# Patient Record
Sex: Female | Born: 1960
Health system: Southern US, Community
[De-identification: ages and names within clinical notes are randomized; demographics above are authoritative.]

## PROBLEM LIST (undated history)

## (undated) DIAGNOSIS — E785 Hyperlipidemia, unspecified: Secondary | ICD-10-CM

## (undated) HISTORY — PX: CERVICAL CONE BIOPSY: SUR198

## (undated) HISTORY — PX: BREAST CYST EXCISION: SHX579

---

## 2020-04-26 ENCOUNTER — Other Ambulatory Visit: Payer: Self-pay

## 2020-04-26 DIAGNOSIS — Z20822 Contact with and (suspected) exposure to covid-19: Secondary | ICD-10-CM

## 2020-04-27 LAB — NOVEL CORONAVIRUS, NAA: SARS-CoV-2, NAA: NOT DETECTED

## 2020-04-27 LAB — SARS-COV-2, NAA 2 DAY TAT

## 2020-08-30 ENCOUNTER — Other Ambulatory Visit: Payer: Self-pay | Admitting: Family Medicine

## 2020-08-30 DIAGNOSIS — Z1231 Encounter for screening mammogram for malignant neoplasm of breast: Secondary | ICD-10-CM

## 2020-09-03 ENCOUNTER — Ambulatory Visit
Admission: RE | Admit: 2020-09-03 | Discharge: 2020-09-03 | Disposition: A | Discharge status: Home / Self Care | Payer: 59 | Source: Ambulatory Visit | Attending: Family Medicine | Admitting: Family Medicine

## 2020-09-03 ENCOUNTER — Other Ambulatory Visit: Payer: Self-pay

## 2020-09-03 DIAGNOSIS — Z1231 Encounter for screening mammogram for malignant neoplasm of breast: Secondary | ICD-10-CM | POA: Insufficient documentation

## 2020-09-08 ENCOUNTER — Other Ambulatory Visit: Payer: Self-pay

## 2020-09-08 DIAGNOSIS — R5383 Other fatigue: Secondary | ICD-10-CM | POA: Diagnosis not present

## 2020-09-08 DIAGNOSIS — Z1389 Encounter for screening for other disorder: Secondary | ICD-10-CM | POA: Diagnosis not present

## 2020-09-08 DIAGNOSIS — E559 Vitamin D deficiency, unspecified: Secondary | ICD-10-CM | POA: Diagnosis not present

## 2020-09-08 DIAGNOSIS — L608 Other nail disorders: Secondary | ICD-10-CM | POA: Diagnosis not present

## 2020-09-08 DIAGNOSIS — E785 Hyperlipidemia, unspecified: Secondary | ICD-10-CM | POA: Diagnosis not present

## 2020-09-08 MED ORDER — TERBINAFINE HCL 250 MG PO TABS
ORAL_TABLET | ORAL | 0 refills | Status: DC
  Filled 2020-09-08: qty 90, 90d supply, fill #0

## 2020-09-08 MED ORDER — CICLOPIROX 8 % EX SOLN
CUTANEOUS | 1 refills | Status: DC
  Filled 2020-09-08: qty 6.6, 30d supply, fill #0

## 2020-09-08 MED ORDER — FAMCICLOVIR 500 MG PO TABS
ORAL_TABLET | ORAL | 2 refills | Status: DC
  Filled 2020-09-08: qty 6, 2d supply, fill #0
  Filled 2020-10-15: qty 6, 2d supply, fill #1

## 2020-09-08 MED ORDER — ROSUVASTATIN CALCIUM 20 MG PO TABS
ORAL_TABLET | ORAL | 0 refills | Status: DC
  Filled 2020-09-08: qty 90, 90d supply, fill #0

## 2020-09-09 ENCOUNTER — Other Ambulatory Visit: Payer: Self-pay

## 2020-09-10 ENCOUNTER — Inpatient Hospital Stay
Admission: RE | Admit: 2020-09-10 | Discharge: 2020-09-10 | Disposition: A | Discharge status: Home / Self Care | Payer: Self-pay | Source: Ambulatory Visit | Attending: *Deleted | Admitting: *Deleted

## 2020-09-10 ENCOUNTER — Other Ambulatory Visit: Payer: Self-pay | Admitting: *Deleted

## 2020-09-10 DIAGNOSIS — Z1231 Encounter for screening mammogram for malignant neoplasm of breast: Secondary | ICD-10-CM

## 2020-09-13 ENCOUNTER — Inpatient Hospital Stay
Admission: RE | Admit: 2020-09-13 | Discharge: 2020-09-13 | Disposition: A | Discharge status: Home / Self Care | Payer: Self-pay | Source: Ambulatory Visit | Attending: *Deleted | Admitting: *Deleted

## 2020-09-13 ENCOUNTER — Other Ambulatory Visit: Payer: Self-pay | Admitting: *Deleted

## 2020-09-13 DIAGNOSIS — Z1231 Encounter for screening mammogram for malignant neoplasm of breast: Secondary | ICD-10-CM

## 2020-09-17 DIAGNOSIS — R5381 Other malaise: Secondary | ICD-10-CM | POA: Diagnosis not present

## 2020-09-17 DIAGNOSIS — E559 Vitamin D deficiency, unspecified: Secondary | ICD-10-CM | POA: Diagnosis not present

## 2020-09-17 DIAGNOSIS — R7989 Other specified abnormal findings of blood chemistry: Secondary | ICD-10-CM | POA: Diagnosis not present

## 2020-09-17 DIAGNOSIS — R5383 Other fatigue: Secondary | ICD-10-CM | POA: Diagnosis not present

## 2020-09-17 DIAGNOSIS — Z79899 Other long term (current) drug therapy: Secondary | ICD-10-CM | POA: Diagnosis not present

## 2020-09-17 DIAGNOSIS — I10 Essential (primary) hypertension: Secondary | ICD-10-CM | POA: Diagnosis not present

## 2020-09-17 DIAGNOSIS — Z8249 Family history of ischemic heart disease and other diseases of the circulatory system: Secondary | ICD-10-CM | POA: Diagnosis not present

## 2020-09-17 DIAGNOSIS — E7800 Pure hypercholesterolemia, unspecified: Secondary | ICD-10-CM | POA: Diagnosis not present

## 2020-09-17 DIAGNOSIS — I739 Peripheral vascular disease, unspecified: Secondary | ICD-10-CM | POA: Diagnosis not present

## 2020-09-17 DIAGNOSIS — E039 Hypothyroidism, unspecified: Secondary | ICD-10-CM | POA: Diagnosis not present

## 2020-10-05 ENCOUNTER — Ambulatory Visit: Payer: 59 | Attending: Internal Medicine

## 2020-10-05 DIAGNOSIS — Z23 Encounter for immunization: Secondary | ICD-10-CM

## 2020-10-05 NOTE — Progress Notes (Signed)
   Covid-19 Vaccination Clinic  Name:  Faith Patricelli    MRN: [id number] DOB: [birth year]  10/05/2020  Ms. Leano was observed post Covid-19 immunization for 15 minutes without incident. She was provided with Vaccine Information Sheet and instruction to access the V-Safe system.   Ms. Apt was instructed to call 911 with any severe reactions post vaccine: Marland Kitchen Difficulty breathing  . Swelling of face and throat  . A fast heartbeat  . A bad rash all over body  . Dizziness and weakness   Immunizations Administered    Name Date Dose VIS Date Route   PFIZER Comrnaty(Gray TOP) Covid-19 Vaccine 10/05/2020  2:44 PM 0.3 mL 04/08/2020 Intramuscular   Manufacturer: [employer], [employer]   Lot: [id number]   NDC: [address]-2     Drusilla Kanner, PharmD, MBA Clinical Acute Care Pharmacist

## 2020-10-06 ENCOUNTER — Other Ambulatory Visit: Payer: Self-pay

## 2020-10-06 MED ORDER — PFIZER-BIONT COVID-19 VAC-TRIS 30 MCG/0.3ML IM SUSP
INTRAMUSCULAR | 0 refills | Status: DC
Start: 2020-10-05 — End: 2022-10-18
  Filled 2020-10-06: qty 0.3, 20d supply, fill #0

## 2020-10-15 ENCOUNTER — Other Ambulatory Visit: Payer: Self-pay

## 2020-10-19 ENCOUNTER — Other Ambulatory Visit: Payer: Self-pay

## 2020-10-22 ENCOUNTER — Other Ambulatory Visit
Admission: RE | Admit: 2020-10-22 | Discharge: 2020-10-22 | Disposition: A | Discharge status: Home / Self Care | Payer: 59 | Source: Skilled Nursing Facility | Attending: Internal Medicine | Admitting: Internal Medicine

## 2020-11-02 ENCOUNTER — Other Ambulatory Visit: Payer: Self-pay

## 2020-11-29 DIAGNOSIS — G40909 Epilepsy, unspecified, not intractable, without status epilepticus: Secondary | ICD-10-CM | POA: Insufficient documentation

## 2020-12-10 DIAGNOSIS — R569 Unspecified convulsions: Secondary | ICD-10-CM | POA: Insufficient documentation

## 2020-12-10 DIAGNOSIS — R600 Localized edema: Secondary | ICD-10-CM | POA: Insufficient documentation

## 2020-12-10 DIAGNOSIS — R55 Syncope and collapse: Secondary | ICD-10-CM | POA: Insufficient documentation

## 2020-12-10 DIAGNOSIS — R4189 Other symptoms and signs involving cognitive functions and awareness: Secondary | ICD-10-CM | POA: Insufficient documentation

## 2020-12-10 DIAGNOSIS — R079 Chest pain, unspecified: Secondary | ICD-10-CM | POA: Insufficient documentation

## 2020-12-30 ENCOUNTER — Ambulatory Visit: Payer: 59 | Admitting: Internal Medicine

## 2021-01-31 ENCOUNTER — Emergency Department
Admission: EM | Admit: 2021-01-31 | Discharge: 2021-01-31 | Disposition: A | Discharge status: Home / Self Care | Payer: BC Managed Care – PPO | Attending: Emergency Medicine | Admitting: Emergency Medicine

## 2021-01-31 ENCOUNTER — Emergency Department: Payer: BC Managed Care – PPO

## 2021-01-31 ENCOUNTER — Encounter: Payer: Self-pay | Admitting: *Deleted

## 2021-01-31 ENCOUNTER — Other Ambulatory Visit: Payer: Self-pay

## 2021-01-31 DIAGNOSIS — R4182 Altered mental status, unspecified: Secondary | ICD-10-CM | POA: Insufficient documentation

## 2021-01-31 DIAGNOSIS — R791 Abnormal coagulation profile: Secondary | ICD-10-CM | POA: Diagnosis not present

## 2021-01-31 DIAGNOSIS — R569 Unspecified convulsions: Secondary | ICD-10-CM | POA: Diagnosis not present

## 2021-01-31 HISTORY — DX: Hyperlipidemia, unspecified: E78.5

## 2021-01-31 LAB — CBC WITH DIFFERENTIAL/PLATELET
Abs Immature Granulocytes: 0.02 10*3/uL (ref 0.00–0.07)
Basophils Absolute: 0.1 10*3/uL (ref 0.0–0.1)
Basophils Relative: 1 %
Eosinophils Absolute: 0.1 10*3/uL (ref 0.0–0.5)
Eosinophils Relative: 2 %
HCT: 44 % (ref 36.0–46.0)
Hemoglobin: 15.2 g/dL — ABNORMAL HIGH (ref 12.0–15.0)
Immature Granulocytes: 0 %
Lymphocytes Relative: 31 %
Lymphs Abs: 2.8 10*3/uL (ref 0.7–4.0)
MCH: 32.8 pg (ref 26.0–34.0)
MCHC: 34.5 g/dL (ref 30.0–36.0)
MCV: 94.8 fL (ref 80.0–100.0)
Monocytes Absolute: 0.6 10*3/uL (ref 0.1–1.0)
Monocytes Relative: 7 %
Neutro Abs: 5.3 10*3/uL (ref 1.7–7.7)
Neutrophils Relative %: 59 %
Platelets: 304 10*3/uL (ref 150–400)
RBC: 4.64 MIL/uL (ref 3.87–5.11)
RDW: 12.7 % (ref 11.5–15.5)
WBC: 8.9 10*3/uL (ref 4.0–10.5)
nRBC: 0 % (ref 0.0–0.2)

## 2021-01-31 LAB — BASIC METABOLIC PANEL
Anion gap: 7 (ref 5–15)
BUN: 17 mg/dL (ref 8–23)
CO2: 29 mmol/L (ref 22–32)
Calcium: 9.3 mg/dL (ref 8.9–10.3)
Chloride: 106 mmol/L (ref 98–111)
Creatinine, Ser: 0.77 mg/dL (ref 0.44–1.00)
GFR, Estimated: >60 mL/min (ref >60)
Glucose, Bld: 97 mg/dL (ref 70–99)
Potassium: 3.9 mmol/L (ref 3.5–5.1)
Sodium: 142 mmol/L (ref 135–145)

## 2021-01-31 LAB — PROTIME-INR
INR: 0.9 (ref 0.8–1.2)
Prothrombin Time: 11.8 seconds (ref 11.4–15.2)

## 2021-01-31 NOTE — ED Provider Notes (Addendum)
Emergency Medicine Provider Triage Evaluation Note  Ellen Sanders, a 60 y.o. female  was evaluated in triage.  Pt complains of syncopal versus absence seizure-like activity. She presents via POV from home after sister witnessed patient "zone out" for about 10 minutes. There was no syncope, vision loss, paralysis, facial droop or slurred speech.  Reported postictal state or urinary incontinence.  Patient presents at this time with no recollection of the episode, but does note she gets a prodrome that she describes as a "brain fog."  Apparently has been cleared by Dr. Cassie Freer from a cardiac standpoint, is currently under the care of neurology with intentions on having an outpatient MRI performed.  Patient has not yet scheduled the procedure.  Review of Systems  Positive: Syncopal episode Negative: CP, SOB  Physical Exam  There were no vitals taken for this visit. Gen:   Awake, no distress  NAD Resp:  Normal effort CTA MSK:   Moves extremities without difficulty  Other:  CVS: RRR  Medical Decision Making  Medically screening exam initiated at 4:41 PM.  Appropriate orders placed.  Orlie Dakin Tolbert was informed that the remainder of the evaluation will be completed by another provider, this initial triage assessment does not replace that evaluation, and the importance of remaining in the ED until their evaluation is complete.  Patient ED evaluation of an episode of syncope versus absence seizure-like activity.   Lissa Hoard, PA-C 01/31/21 1644    Lissa Hoard, PA-C 01/31/21 1649    Lissa Hoard, PA-C 01/31/21 1649    Gilles Chiquito, MD 01/31/21 Paulo Fruit

## 2021-01-31 NOTE — ED Triage Notes (Addendum)
Per patient's report, patient had an episode two hours ago where for approximately 10 minutes she "zoned out", "staring into space." Patient denies LOC and said her sister reported that the patient kept saying she needed to go to the bathroom, but was not incontinent. Patient states she has had 12 episodes that were unwitnessed in 3 months. Patient has been seen by a cardiologist and neurology. Patient states that one of unwitnessed episodes she was incontinent. Patient is alert and oriented, denies weakness or vision problems.Patient report her face was red during the episode, but was not diaphoretic.

## 2021-01-31 NOTE — ED Provider Notes (Signed)
[facility] Emergency Department Provider Note   ____________________________________________    I have reviewed the triage vital signs and the nursing notes.   HISTORY  Chief Complaint Altered mental status     HPI Ellen Sanders is a 60 y.o. female who presents after episode of altered mental status.  Patient reports for many months she has been having brief spells where she does not quite remember what happens.  Today it was witnessed by family and they noted that she seemed to be "out of it ".  She has had EEG, carotid Dopplers and consultation with neurology and cardiology regarding these events.  She does not know the results of her EEG at this time.  She feels fine and has no complaints.  She is here because she is concerned that her symptoms may be related to a stroke  Past Medical History:  Diagnosis Date   Hyperlipidemia     There are no problems to display for this patient.   Past Surgical History:  Procedure Laterality Date   BREAST CYST EXCISION Right    CERVICAL CONE BIOPSY      Prior to Admission medications   Medication Sig Start Date End Date Taking? Authorizing Provider  ciclopirox (PENLAC) 8 % solution small amount TOPICAL every day 09/08/20     COVID-19 mRNA Vac-TriS, Pfizer, (PFIZER-BIONT COVID-19 VAC-TRIS) SUSP injection Inject into the muscle. 10/05/20   Judyann Munson, MD  famciclovir [facility]) 500 MG tablet 3 ORAL one time 09/08/20     rosuvastatin (CRESTOR) 20 MG tablet 1 ORAL every day 09/08/20     terbinafine (LAMISIL) 250 MG tablet Take 1 tablet by mouth once daily 09/08/20        Allergies Patient has no known allergies.  Family History  Problem Relation Age of Onset   Breast cancer Maternal Aunt    Breast cancer Cousin     Social History Social History   Tobacco Use   Smoking status: Never   Smokeless tobacco: Never  Substance Use Topics   Alcohol use: Yes    Comment: occasionally   Drug use: Not  Currently    Types: Marijuana    Review of Systems  Constitutional: No fever/chills Eyes: No visual changes.  ENT: No sore throat. Cardiovascular: Denies chest pain. Respiratory: Denies shortness of breath. Gastrointestinal: No abdominal pain.  No nausea, no vomiting.   Genitourinary: Negative for dysuria. Musculoskeletal: Negative for back pain. Skin: Negative for rash. Neurological: As above, no weakness   ____________________________________________   PHYSICAL EXAM:  VITAL SIGNS: ED Triage Vitals  Enc Vitals Group     BP 01/31/21 1640 (!) 148/83     Pulse Rate 01/31/21 1640 66     Resp 01/31/21 1640 18     Temp 01/31/21 1640 98.4 F (36.9 C)     Temp Source 01/31/21 1640 Oral     SpO2 01/31/21 1640 97 %     Weight 01/31/21 1643 88.5 kg (195 lb)     Height 01/31/21 1643 1.676 m (5\' 6" )     Head Circumference --      Peak Flow --      Pain Score 01/31/21 1642 0     Pain Loc --      Pain Edu? --      Excl. in GC? --     Constitutional: Alert and oriented. No acute distress. Pleasant and interactive Eyes: Conjunctivae are normal.  Head: Atraumatic. Nose: No congestion/rhinnorhea. Mouth/Throat: Mucous membranes  are moist.   Neck:  Painless ROM Cardiovascular: Normal rate, regular rhythm.   Good peripheral circulation. Respiratory: Normal respiratory effort.  No retractions.   Genitourinary: deferred Musculoskeletal: No lower extremity tenderness nor edema.  Warm and well perfused Neurologic:  Normal speech and language. No gross focal neurologic deficits are appreciated.  Neurologically intact Skin:  Skin is warm, dry and intact. No rash noted. Psychiatric: Mood and affect are normal. Speech and behavior are normal.  ____________________________________________   LABS (all labs ordered are listed, but only abnormal results are displayed)  Labs Reviewed  CBC WITH DIFFERENTIAL/PLATELET - Abnormal; Notable for the following components:      Result Value    Hemoglobin 15.2 (*)    All other components within normal limits  BASIC METABOLIC PANEL  PROTIME-INR   ____________________________________________  EKG   ____________________________________________  RADIOLOGY  CT head reviewed by me, no acute abnormality on my read, pending radiology MR brain ____________________________________________   PROCEDURES  Procedure(s) performed: No  Procedures   Critical Care performed: No ____________________________________________   INITIAL IMPRESSION / ASSESSMENT AND PLAN / ED COURSE  Pertinent labs & imaging results that were available during my care of the patient were reviewed by me and considered in my medical decision making (see chart for details).   Patient presents with multiple intermittent episodes over the last year that sound like partial seizures given her lack of remembering the events and occasional incontinence that accompanies the event.  She has been having appropriate outpatient work-up neck she has an MRI scheduled for later this week.  She presents today because family was concerned that symptoms may be related to a stroke which is unlikely given her repeat episodes however she has not had any brain imaging performed at this time.  Will obtain CT  CT with possible artifact versus abnormality, MRI is recommended  MRI is normal, patient is reassured, she remains well-appearing and in no distress.  Appropriate for outpatient continued follow-up    ____________________________________________   FINAL CLINICAL IMPRESSION(S) / ED DIAGNOSES  Final diagnoses:  Partial seizure (HCC)        Note:  This document was prepared using Dragon voice recognition software and may include unintentional dictation errors.    Jene Every, MD 01/31/21 2202

## 2021-02-04 DIAGNOSIS — R569 Unspecified convulsions: Secondary | ICD-10-CM | POA: Insufficient documentation

## 2021-05-19 ENCOUNTER — Other Ambulatory Visit: Payer: Self-pay | Admitting: Adult Health

## 2021-05-19 DIAGNOSIS — Z1231 Encounter for screening mammogram for malignant neoplasm of breast: Secondary | ICD-10-CM

## 2022-01-20 DIAGNOSIS — I493 Ventricular premature depolarization: Secondary | ICD-10-CM | POA: Insufficient documentation

## 2022-01-20 DIAGNOSIS — E785 Hyperlipidemia, unspecified: Secondary | ICD-10-CM | POA: Insufficient documentation

## 2022-01-20 DIAGNOSIS — I739 Peripheral vascular disease, unspecified: Secondary | ICD-10-CM | POA: Insufficient documentation

## 2022-02-06 DIAGNOSIS — R030 Elevated blood-pressure reading, without diagnosis of hypertension: Secondary | ICD-10-CM | POA: Insufficient documentation

## 2022-07-02 LAB — COLOGUARD: COLOGUARD: NEGATIVE

## 2022-08-23 ENCOUNTER — Ambulatory Visit: Payer: Self-pay

## 2022-10-12 ENCOUNTER — Ambulatory Visit: Payer: BC Managed Care – PPO | Admitting: Podiatry

## 2022-10-18 ENCOUNTER — Encounter: Payer: Self-pay | Admitting: Podiatry

## 2022-10-18 ENCOUNTER — Ambulatory Visit: Payer: BC Managed Care – PPO | Admitting: Podiatry

## 2022-10-18 DIAGNOSIS — L603 Nail dystrophy: Secondary | ICD-10-CM

## 2022-10-18 NOTE — Progress Notes (Signed)
  Subjective:  Patient ID: Ellen Sanders, female    DOB: 19-Jun-1960,  MRN: [id number] HPI Chief Complaint  Patient presents with   Nail Problem    Hallux bilateral - thick, dark nails x 7 years, tried OTC topical, took several months of oral meds-elevated liver enzymes, so stopped, does have psoriasis, dermatology cultured - did show fungus-put on ciclopirox instead   New Patient (Initial Visit)    62 y.o. female presents with the above complaint.   ROS: Denies fever chills nausea vomit muscle aches pains calf pain back pain chest pain shortness of breath.  Past Medical History:  Diagnosis Date   Hyperlipidemia    Past Surgical History:  Procedure Laterality Date   BREAST CYST EXCISION Right    CERVICAL CONE BIOPSY      Current Outpatient Medications:    clobetasol cream (TEMOVATE) 0.05 %, Apply 1 Application topically 2 (two) times daily., Disp: , Rfl:    ezetimibe (ZETIA) 10 MG tablet, Take 1 tablet by mouth daily., Disp: , Rfl:    lamoTRIgine (LAMICTAL) 100 MG tablet, Take 100 mg by mouth at bedtime., Disp: , Rfl:    lamoTRIgine (LAMICTAL) 25 MG tablet, Take by mouth every morning., Disp: , Rfl:   No Known Allergies Review of Systems Objective:  There were no vitals filed for this visit.  General: Well developed, nourished, in no acute distress, alert and oriented x3   Dermatological: Skin is warm, dry and supple bilateral. Nails x 10 are well maintained; remaining integument appears unremarkable at this time. There are no open sores, no preulcerative lesions, no rash or signs of infection present.  Hallux nails bilaterally demonstrate thick yellow dystrophic possibly mycotic nail plates in total there appears to be a new nail growing beneath the left nail plate most likely secondary to trauma to the proximal nail fold.  Patient did state that there had been an infection there that did go on to heal.    Vascular: Dorsalis Pedis artery and Posterior Tibial artery pedal  pulses are 2/4 bilateral with immedate capillary fill time. Pedal hair growth present. No varicosities and no lower extremity edema present bilateral.   Neruologic: Grossly intact via light touch bilateral. Vibratory intact via tuning fork bilateral. Protective threshold with Semmes Wienstein monofilament intact to all pedal sites bilateral. Patellar and Achilles deep tendon reflexes 2+ bilateral. No Babinski or clonus noted bilateral.   Musculoskeletal: No gross boney pedal deformities bilateral. No pain, crepitus, or limitation noted with foot and ankle range of motion bilateral. Muscular strength 5/5 in all groups tested bilateral.  Gait: Unassisted, Nonantalgic.    Radiographs:  None taken  Assessment & Plan:   Assessment: Nail dystrophy hallux bilaterally  Plan: Samples of the skin and nail were taken today for pathologic evaluation we will follow-up with her in 1 month     Ahmir Bracken T. [place], [place]

## 2022-10-31 ENCOUNTER — Telehealth: Payer: Self-pay | Admitting: *Deleted

## 2022-10-31 NOTE — Telephone Encounter (Signed)
-----   Message from Elinor Parkinson, [place] sent at 10/30/2022 11:49 AM EDT ----- Negative for fungus

## 2022-11-08 IMAGING — CT CT HEAD W/O CM
3 series · 16 of 47 positions shown, 19 images · non-contrast
Comparison: None.

CLINICAL DATA: Possible absence seizure.

EXAM:
CT HEAD WITHOUT CONTRAST
TECHNIQUE: Contiguous axial images were obtained from the base of the skull
through the vertex without intravenous contrast.

[Series 3: head wo · axial · 0.44mm/px · z∈[+634,+759]mm · 10 of 30 slices shown, 13 images]
[im 3/30  brain]
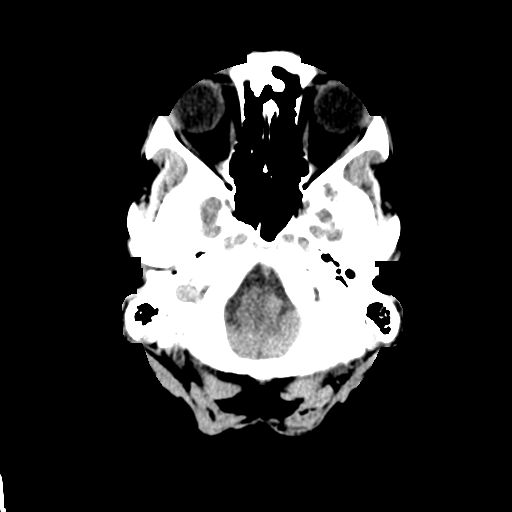
[im 3/30  bone]
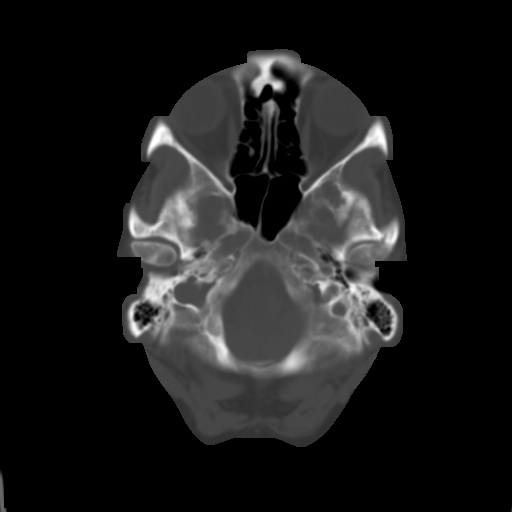
[im 6/30  brain]
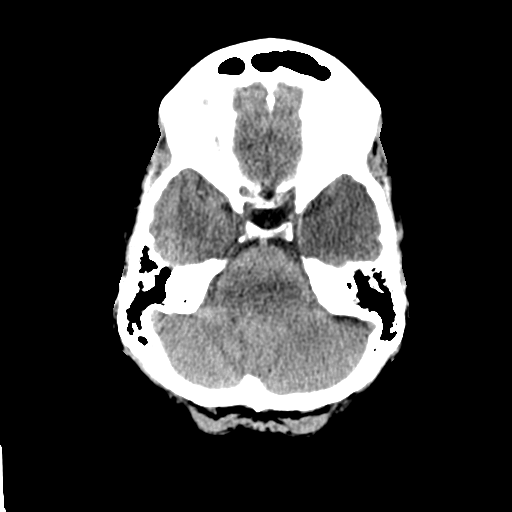
[im 9/30  brain]
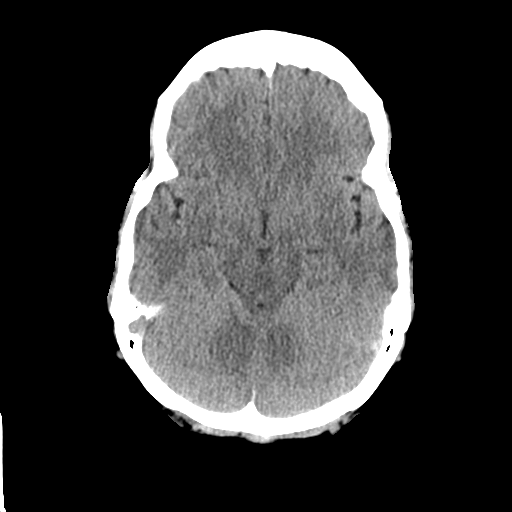
[im 11/30  brain]
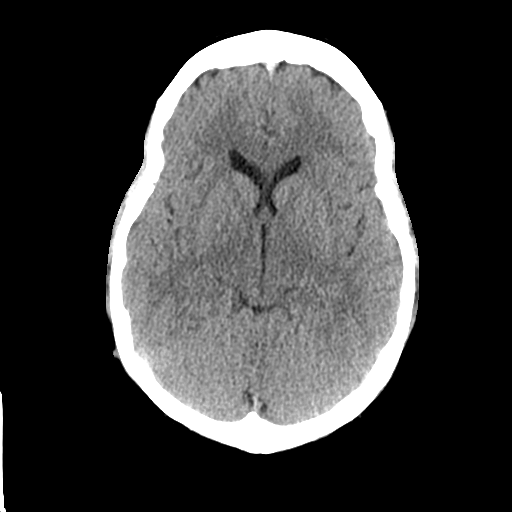
[im 14/30  brain]
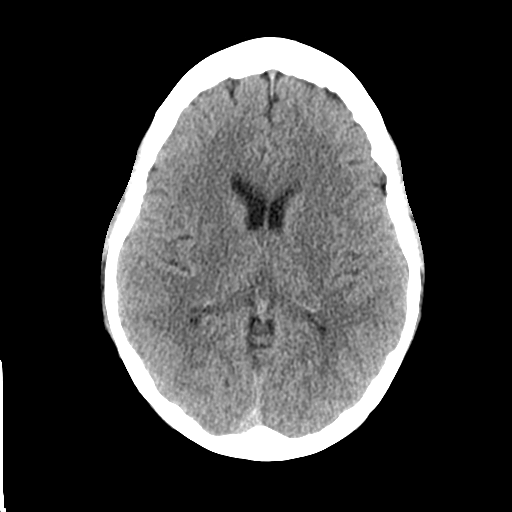
[im 14/30  bone]
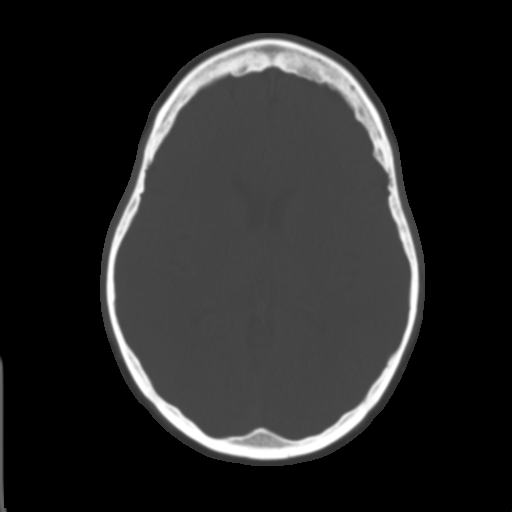
[im 17/30  brain]
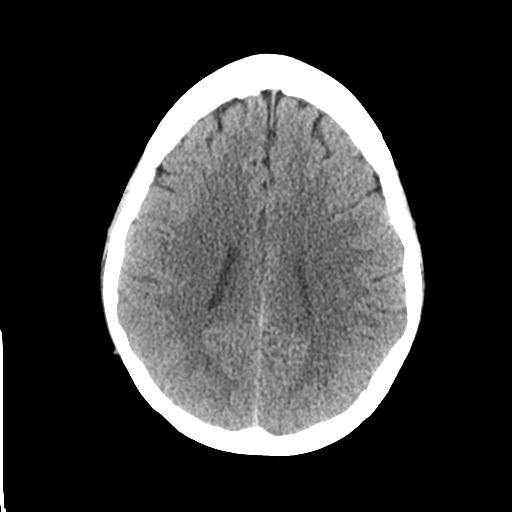
[im 20/30  brain]
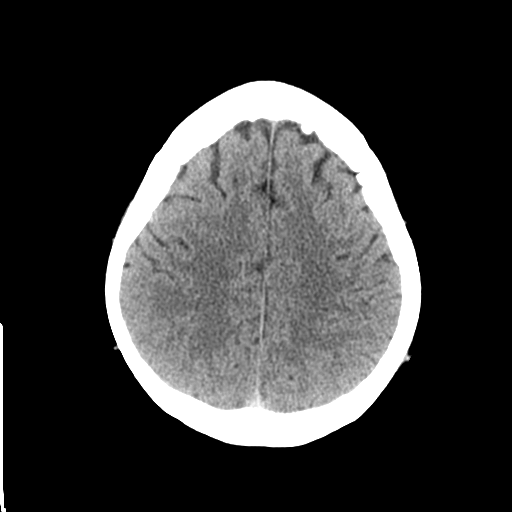
[im 23/30  brain]
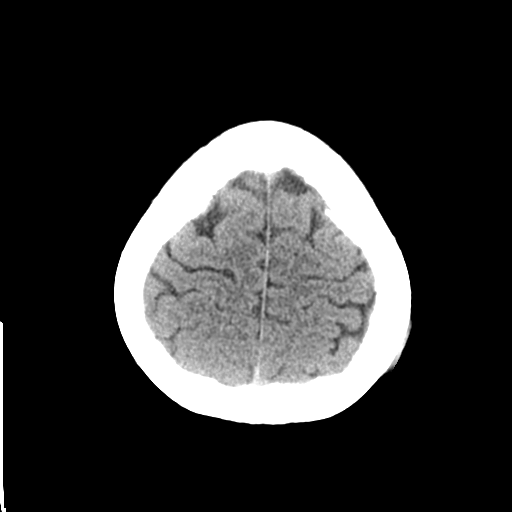
[im 25/30  brain]
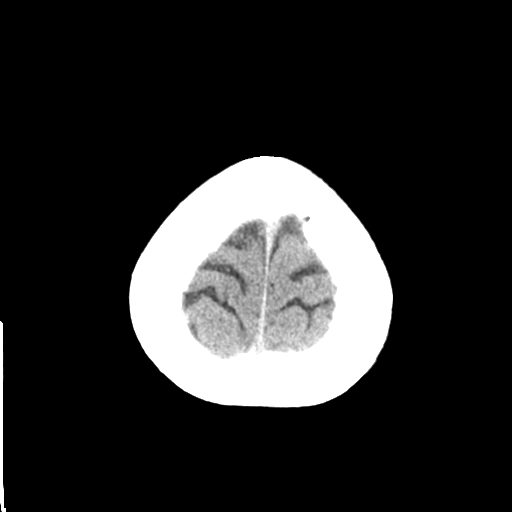
[im 25/30  bone]
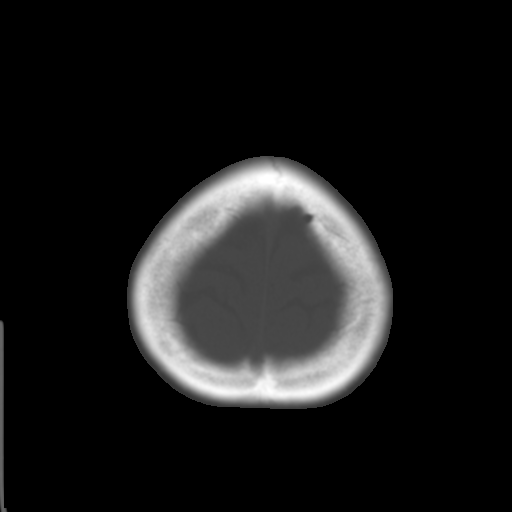
[im 28/30  brain]
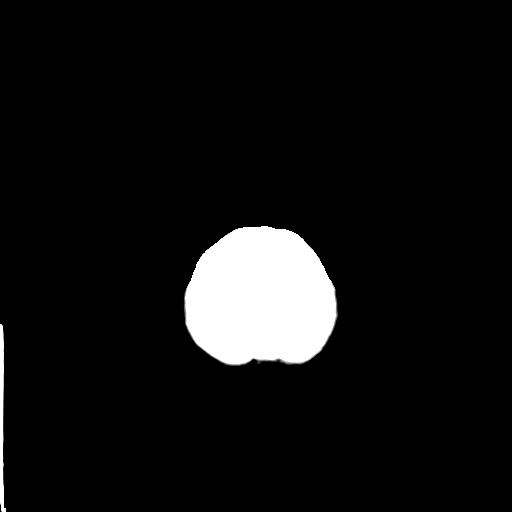

[Series 4: coronal soft tissue · coronal · 0.32mm/px · 3 of 64 slices shown]
[im 22/64  brain]
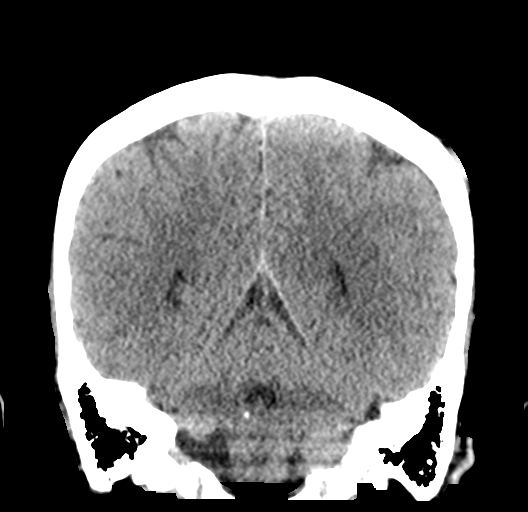
[im 29/64  brain]
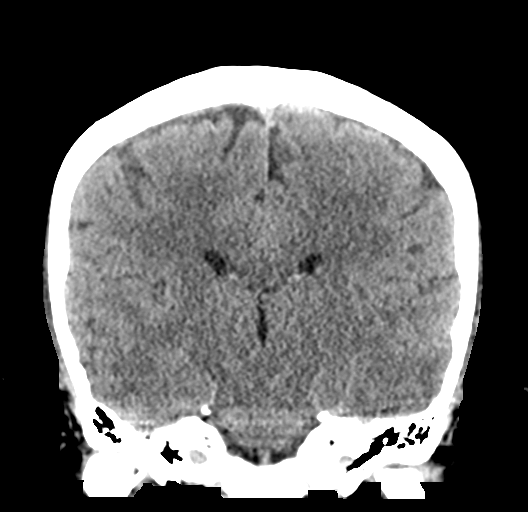
[im 36/64  brain]
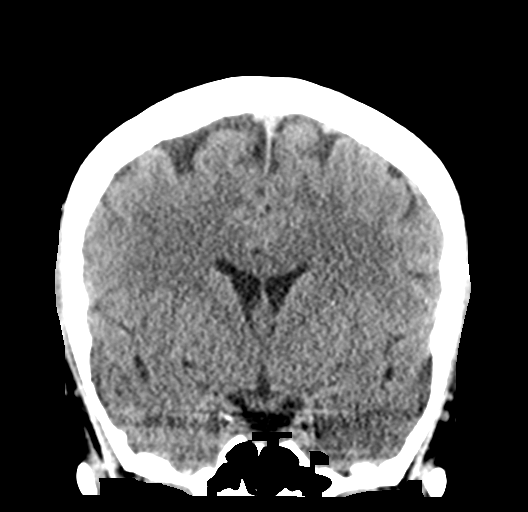

[Series 5: sagittal soft tissue · sagittal · 0.32mm/px · 3 of 57 slices shown]
[im 19/57  brain]
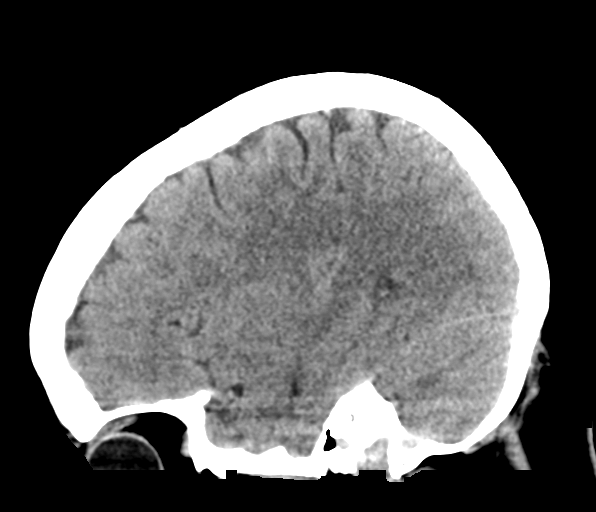
[im 29/57  brain]
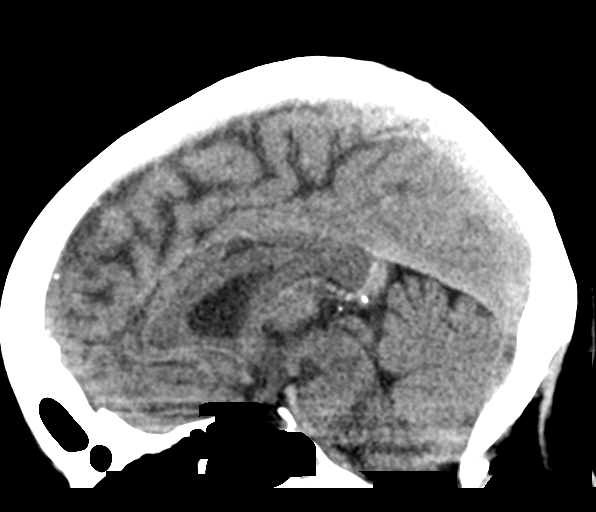
[im 38/57  brain]
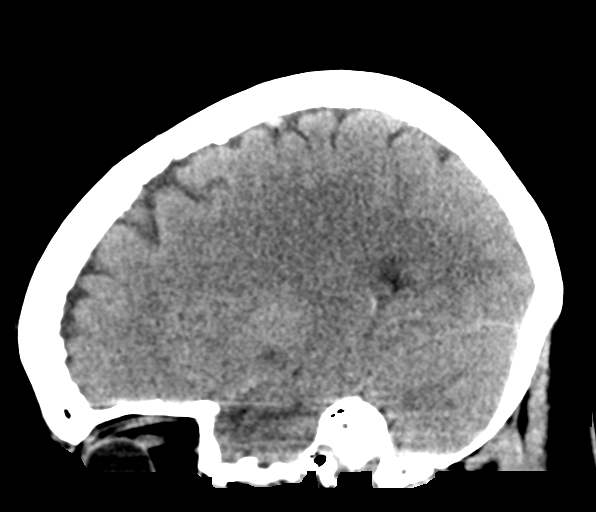

[16 of 47 positions shown; findings below may reference images not displayed]

FINDINGS: Brain: Asymmetric hypodensity in the left anterior temporal lobe
(series 3, image 6). No evidence of acute hemorrhage, hydrocephalus,
extra-axial collection or mass lesion/mass effect.

Vascular: No hyperdense vessel or unexpected calcification.

Skull: Normal. Negative for fracture or focal lesion.

Sinuses/Orbits: No acute finding.

Other: None.
IMPRESSION: 1. Asymmetric hypodensity in the left anterior temporal lobe. While
possibly streak artifact, infarct or cerebral edema could have a
similar appearance. Recommend MRI for further evaluation.

## 2022-11-15 ENCOUNTER — Encounter: Payer: Self-pay | Admitting: Podiatry

## 2022-11-15 ENCOUNTER — Ambulatory Visit: Payer: BC Managed Care – PPO | Admitting: Podiatry

## 2022-11-15 DIAGNOSIS — L603 Nail dystrophy: Secondary | ICD-10-CM

## 2022-11-15 MED ORDER — NEOMYCIN-POLYMYXIN-HC 1 % OT SOLN: OTIC | 4 refills | Status: AC

## 2022-11-15 NOTE — Progress Notes (Signed)
Ellen Sanders presents today for follow-up of her nail pathology.  Objective: Vital signs are stable she is alert and oriented x 3.  There is been no significant change in physical exam.  Nail pathology does demonstrate distal hyperkeratosis subungual colonization with bacteria.  This could be associated with psoriasis and/or eczema.  Assessment: Subungual bacterial colonization.  Psoriasis of the nail or eczema of the nail.  Plan: Started her on Corticosporin otic to be applied beneath the nail plate.  I will follow-up with her on an as-needed basis.
